# Patient Record
Sex: Male | Born: 2000 | Race: Black or African American | Hispanic: No | Marital: Single | Smoking: Never smoker
Health system: Southern US, Community
[De-identification: ages and names within clinical notes are randomized; demographics above are authoritative.]

## PROBLEM LIST (undated history)

## (undated) DIAGNOSIS — G43909 Migraine, unspecified, not intractable, without status migrainosus: Secondary | ICD-10-CM

## (undated) DIAGNOSIS — F909 Attention-deficit hyperactivity disorder, unspecified type: Secondary | ICD-10-CM

---
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
## 2022-04-02 ENCOUNTER — Ambulatory Visit: Payer: Self-pay | Admitting: Critical Care Medicine

## 2023-01-16 ENCOUNTER — Emergency Department (HOSPITAL_COMMUNITY): Admission: EM | Admit: 2023-01-16 | Discharge: 2023-01-16 | Discharge status: Against Medical Advice | Payer: Self-pay

## 2023-01-16 NOTE — ED Notes (Signed)
Patient called for triage x3 with no answer. Assumed to have left.

## 2023-07-25 ENCOUNTER — Ambulatory Visit: Admission: EM | Admit: 2023-07-25 | Discharge: 2023-07-25 | Discharge status: Against Medical Advice | Payer: Self-pay

## 2023-07-25 HISTORY — DX: Migraine, unspecified, not intractable, without status migrainosus: G43.909

## 2023-07-25 NOTE — ED Triage Notes (Signed)
Pt states headache for the past 2 days with ringing in his right ear.  States he has a history of migraines and usually takes tylenol,but it is not working for this headache.

## 2023-09-26 ENCOUNTER — Emergency Department (HOSPITAL_COMMUNITY): Payer: Self-pay

## 2023-09-26 ENCOUNTER — Emergency Department (HOSPITAL_COMMUNITY)
Admission: EM | Admit: 2023-09-26 | Discharge: 2023-09-26 | Discharge status: Against Medical Advice | Payer: Self-pay | Attending: Emergency Medicine | Admitting: Emergency Medicine

## 2023-09-26 ENCOUNTER — Other Ambulatory Visit: Payer: Self-pay

## 2023-09-26 DIAGNOSIS — W25XXXA Contact with sharp glass, initial encounter: Secondary | ICD-10-CM | POA: Insufficient documentation

## 2023-09-26 DIAGNOSIS — S61411A Laceration without foreign body of right hand, initial encounter: Secondary | ICD-10-CM | POA: Insufficient documentation

## 2023-09-26 DIAGNOSIS — Y9371 Activity, boxing: Secondary | ICD-10-CM | POA: Insufficient documentation

## 2023-09-26 NOTE — ED Triage Notes (Signed)
 Pt was practicing boxing while watching self in mirror and accidentally hit mirror with right hand. Right middle knuckle laceration. Hand wrapped in triage and bleeding controlled. Unknown last tetanus shot

## 2023-09-26 NOTE — ED Notes (Signed)
 Pt states he is leaving and will return tomorrow, moved to [facility].

## 2023-09-27 ENCOUNTER — Emergency Department (HOSPITAL_COMMUNITY)
Admission: EM | Admit: 2023-09-27 | Discharge: 2023-09-27 | Discharge status: Against Medical Advice | Payer: Self-pay | Attending: Emergency Medicine | Admitting: Emergency Medicine

## 2023-09-27 ENCOUNTER — Other Ambulatory Visit: Payer: Self-pay

## 2023-09-27 ENCOUNTER — Encounter (HOSPITAL_COMMUNITY): Payer: Self-pay

## 2023-09-27 DIAGNOSIS — W25XXXA Contact with sharp glass, initial encounter: Secondary | ICD-10-CM | POA: Insufficient documentation

## 2023-09-27 DIAGNOSIS — S61212A Laceration without foreign body of right middle finger without damage to nail, initial encounter: Secondary | ICD-10-CM | POA: Insufficient documentation

## 2023-09-27 DIAGNOSIS — Z5321 Procedure and treatment not carried out due to patient leaving prior to being seen by health care provider: Secondary | ICD-10-CM | POA: Insufficient documentation

## 2023-09-27 NOTE — ED Notes (Signed)
 Verbal consent given for MSE

## 2023-09-27 NOTE — ED Notes (Signed)
 Pt requesting hand to be wrapped. RN wrapped hand with non-adhesive guaze and coban. Bleeding is controlled

## 2023-09-27 NOTE — ED Triage Notes (Addendum)
 Pt states he accidentally hit window yesterday, broke glass, endorses small lac to R middle finger; LWBS yesterday due to having to work; endorses some pain; wound wrapped on arrival, bleeding controlled

## 2023-11-14 ENCOUNTER — Encounter (HOSPITAL_COMMUNITY): Payer: Self-pay | Admitting: Emergency Medicine

## 2023-11-14 ENCOUNTER — Ambulatory Visit (HOSPITAL_COMMUNITY)
Admission: EM | Admit: 2023-11-14 | Discharge: 2023-11-14 | Disposition: A | Discharge status: Home / Self Care | Payer: Self-pay | Attending: Emergency Medicine | Admitting: Emergency Medicine

## 2023-11-14 DIAGNOSIS — K047 Periapical abscess without sinus: Secondary | ICD-10-CM

## 2023-11-14 DIAGNOSIS — K0889 Other specified disorders of teeth and supporting structures: Secondary | ICD-10-CM

## 2023-11-14 HISTORY — DX: Attention-deficit hyperactivity disorder, unspecified type: F90.9

## 2023-11-14 MED ORDER — AMOXICILLIN-POT CLAVULANATE 875-125 MG PO TABS: 1.0000 | ORAL_TABLET | Freq: Two times a day (BID) | ORAL | 0 refills | Status: DC

## 2023-11-14 MED ORDER — IBUPROFEN 800 MG PO TABS: 800.0000 mg | ORAL_TABLET | Freq: Three times a day (TID) | ORAL | 0 refills | Status: DC

## 2023-11-14 NOTE — Discharge Instructions (Signed)
 Start taking Augmentin twice daily for 7 days for dental abscess.  You can alternate between 800 mg ibuprofen and Tylenol as needed for pain.  Please keep your appointment with your dentist on Tuesday to follow-up.  Return here as needed.

## 2023-11-14 NOTE — ED Provider Notes (Signed)
 [facility]    CSN: [id number] Arrival date & time: 11/14/23  1650      History   Chief Complaint Chief Complaint  Patient presents with   Oral Swelling    HPI Luis Rasmussen is a 23 y.o. male.   Patient presents with right upper dental pain and swelling that began last night.  Denies fever.  Patient denies taking anything for pain.  Patient reports history of dental abscess.     Past Medical History:  Diagnosis Date   ADHD    Migraines     Patient Active Problem List   Diagnosis Date Noted   History of sore throat 12/20/2016   Right knee injury 01/07/2014   Allergic rhinitis 01/16/2013   Attention deficit hyperactivity disorder (ADHD) 03/01/2010    History reviewed. No pertinent surgical history.     Home Medications    Prior to Admission medications   Medication Sig Start Date End Date Taking? Authorizing Provider  amoxicillin-clavulanate (AUGMENTIN) 875-125 MG tablet Take 1 tablet by mouth every 12 (twelve) hours. 11/14/23  Yes Susann Givens, Krishiv Sandler A, NP  ibuprofen (ADVIL) 800 MG tablet Take 1 tablet (800 mg total) by mouth 3 (three) times daily. 11/14/23  Yes Susann Givens, Itai Barbian A, NP  albuterol (PROVENTIL) (2.5 MG/3ML) 0.083% nebulizer solution Take 3 mLs (2.5 mg total) by nebulization every 4 (four) hours as needed for Wheezing (Please include all supplies and machine.). 11/08/10 11/17/10  Monica Becton, MD    Family History No family history on file.  Social History Social History   Tobacco Use   Smoking status: Never   Smokeless tobacco: Never  Vaping Use   Vaping status: Every Day  Substance Use Topics   Alcohol use: Not Currently   Drug use: Never     Allergies   Patient has no known allergies.   Review of Systems Review of Systems  Per HPI  Physical Exam Triage Vital Signs ED Triage Vitals  Encounter Vitals Group     BP 11/14/23 1748 (!) 150/128     Systolic BP Percentile --      Diastolic BP Percentile --       Pulse Rate 11/14/23 1748 99     Resp 11/14/23 1748 14     Temp 11/14/23 1748 98.4 F (36.9 C)     Temp Source 11/14/23 1748 Oral     SpO2 11/14/23 1748 99 %     Weight --      Height --      Head Circumference --      Peak Flow --      Pain Score 11/14/23 1746 2     Pain Loc --      Pain Education --      Exclude from Growth Chart --    No data found.  Updated Vital Signs BP 119/85   Pulse 99   Temp 98.4 F (36.9 C) (Oral)   Resp 14   SpO2 99%   Visual Acuity Right Eye Distance:   Left Eye Distance:   Bilateral Distance:    Right Eye Near:   Left Eye Near:    Bilateral Near:     Physical Exam Vitals and nursing note reviewed.  Constitutional:      General: He is awake. He is not in acute distress.    Appearance: Normal appearance. He is well-developed and well-groomed. He is not ill-appearing.  HENT:     Mouth/Throat:     Dentition: Abnormal dentition. Dental  tenderness, gingival swelling and dental abscesses present.  Neurological:     Mental Status: He is alert.  Psychiatric:        Behavior: Behavior is cooperative.      UC Treatments / Results  Labs (all labs ordered are listed, but only abnormal results are displayed) Labs Reviewed - No data to display  EKG   Radiology No results found.  Procedures Procedures (including critical care time)  Medications Ordered in UC Medications - No data to display  Initial Impression / Assessment and Plan / UC Course  I have reviewed the triage vital signs and the nursing notes.  Pertinent labs & imaging results that were available during my care of the patient were reviewed by me and considered in my medical decision making (see chart for details).     Patient presented with right upper dental pain and swelling that began last night.  Denies fever.  Reports history of dental abscess.  Upon assessment dental tenderness, gingival swelling, and dental abscess with purulent drainage noted to right upper  mouth.  Offered patient Toradol injection for pain, patient declined.  Prescribed Augmentin for dental abscess coverage.  Prescribed 800 mg ibuprofen as needed for pain.  Patient states that he has an appointment with a dentist on this upcoming Tuesday.  Recommended patient keep appointment to follow-up about dental abscess.  Discussed return precautions. Final Clinical Impressions(s) / UC Diagnoses   Final diagnoses:  Dental abscess  Pain, dental     Discharge Instructions      Start taking Augmentin twice daily for 7 days for dental abscess.  You can alternate between 800 mg ibuprofen and Tylenol as needed for pain.  Please keep your appointment with your dentist on Tuesday to follow-up.  Return here as needed.    ED Prescriptions     Medication Sig Dispense Auth. Provider   amoxicillin-clavulanate (AUGMENTIN) 875-125 MG tablet Take 1 tablet by mouth every 12 (twelve) hours. 14 tablet Susann Givens, Tarrie Mcmichen A, NP   ibuprofen (ADVIL) 800 MG tablet Take 1 tablet (800 mg total) by mouth 3 (three) times daily. 21 tablet Wynonia Lawman A, NP      PDMP not reviewed this encounter.   Wynonia Lawman A, NP 11/14/23 1944

## 2023-11-14 NOTE — ED Triage Notes (Signed)
 Pt c/o right dental swelling and pain that last night. Hasn't taken any medications for pain.

## 2024-06-02 ENCOUNTER — Ambulatory Visit
Admission: EM | Admit: 2024-06-02 | Discharge: 2024-06-02 | Disposition: A | Discharge status: Home / Self Care | Payer: Self-pay | Attending: Family Medicine | Admitting: Family Medicine

## 2024-06-02 DIAGNOSIS — B349 Viral infection, unspecified: Secondary | ICD-10-CM

## 2024-06-02 DIAGNOSIS — K047 Periapical abscess without sinus: Secondary | ICD-10-CM

## 2024-06-02 MED ORDER — IBUPROFEN 800 MG PO TABS: 800.0000 mg | ORAL_TABLET | Freq: Three times a day (TID) | ORAL | 0 refills | Status: DC

## 2024-06-02 MED ORDER — ONDANSETRON 4 MG PO TBDP: 4.0000 mg | ORAL_TABLET | Freq: Three times a day (TID) | ORAL | 0 refills | Status: AC | PRN

## 2024-06-02 MED ORDER — AMOXICILLIN-POT CLAVULANATE 875-125 MG PO TABS: 1.0000 | ORAL_TABLET | Freq: Two times a day (BID) | ORAL | 0 refills | Status: DC

## 2024-06-02 NOTE — Discharge Instructions (Signed)
 Start Augmentin  for a suspected developing dental infection. Use ibuprofen  for pain and inflammation. Ondansetron  can be used for nausea and vomiting. Push fluids, get rest.

## 2024-06-02 NOTE — ED Triage Notes (Addendum)
 Pt c/o left upper and lower dental pain x 1 month dentist appt 9/15-c/o nausea, HA, fatigue, abd pain x 3 days-feels sx are r/t the dental issue-taking tylenol/last dose ~6 hours PTA-NAD-steady gait

## 2024-06-02 NOTE — ED Provider Notes (Signed)
 [facility]  Note:  This document was prepared using Conservation officer, historic buildings and may include unintentional dictation errors.  MRN: [id number] DOB: [birth year]  Subjective:   Luis Rasmussen is a 23 y.o. male presenting for 1 month history of persistent left sided dental, oral pain. In the past 3 days has had nausea with vomiting this morning, has had chest pain this morning too. Reports headaches, malaise, fatigue, occasional dry cough. No antibiotics recently. No diarrhea, throat pain, wheezing, runny or stuffy nose. No asthma. Vapes daily. No marijuana. No history of GI disorders.    Current Facility-Administered Medications:    albuterol  (PROVENTIL ) nebulizer solution 2.5 mg, 2.5 mg, Nebulization, Q4H, Koval, Peter  Current Outpatient Medications:    amoxicillin -clavulanate (AUGMENTIN ) 875-125 MG tablet, Take 1 tablet by mouth every 12 (twelve) hours., Disp: 14 tablet, Rfl: 0   ibuprofen  (ADVIL ) 800 MG tablet, Take 1 tablet (800 mg total) by mouth 3 (three) times daily., Disp: 21 tablet, Rfl: 0   No Known Allergies  Past Medical History:  Diagnosis Date   ADHD    Migraines      History reviewed. No pertinent surgical history.  No family history on file.  Social History   Tobacco Use   Smoking status: Never   Smokeless tobacco: Never  Vaping Use   Vaping status: Every Day  Substance Use Topics   Alcohol use: Not Currently   Drug use: Never    ROS   Objective:   Vitals: BP 128/67 (BP Location: Right Arm)   Pulse (!) 50   Temp 98.2 F (36.8 C) (Oral)   Resp 20   SpO2 98%   Physical Exam Constitutional:      General: He is not in acute distress.    Appearance: Normal appearance. He is well-developed and normal weight. He is not ill-appearing, toxic-appearing or diaphoretic.  HENT:     Head: Normocephalic and atraumatic.     Right Ear: External ear normal.     Left Ear: External ear normal.     Nose: Nose normal.      Mouth/Throat:     Mouth: Mucous membranes are moist.     Pharynx: No pharyngeal swelling, oropharyngeal exudate, posterior oropharyngeal erythema or uvula swelling.     Tonsils: No tonsillar exudate or tonsillar abscesses. 0 on the right. 0 on the left.   Eyes:     General: No scleral icterus.       Right eye: No discharge.        Left eye: No discharge.     Extraocular Movements: Extraocular movements intact.  Cardiovascular:     Rate and Rhythm: Normal rate and regular rhythm.     Heart sounds: Normal heart sounds. No murmur heard.    No friction rub. No gallop.  Pulmonary:     Effort: Pulmonary effort is normal. No respiratory distress.     Breath sounds: Normal breath sounds. No stridor. No wheezing, rhonchi or rales.  Abdominal:     General: Bowel sounds are normal. There is no distension.     Palpations: Abdomen is soft. There is no mass.     Tenderness: There is no abdominal tenderness. There is no right CVA tenderness, left CVA tenderness, guarding or rebound.  Musculoskeletal:     Cervical back: Normal range of motion.  Neurological:     Mental Status: He is alert and oriented to person, place, and time.     Cranial Nerves: No cranial nerve  deficit.     Motor: No weakness.     Coordination: Coordination normal.     Gait: Gait normal.  Psychiatric:        Mood and Affect: Mood normal.        Behavior: Behavior normal.        Thought Content: Thought content normal.        Judgment: Judgment normal.     Assessment and Plan :   PDMP not reviewed this encounter.  1. Dental infection   2. Acute viral syndrome    Recommend managing for dental infection with concurrent new onset acute viral syndrome.  Start Augmentin , use Zofran , ibuprofen  and supportive care.  Keep follow-up appointment with a dentist.  Counseled patient on potential for adverse effects with medications prescribed/recommended today, ER and return-to-clinic precautions discussed, patient verbalized  understanding.    Christopher Savannah, [place] 06/02/24 1436

## 2024-07-10 ENCOUNTER — Ambulatory Visit
Admission: EM | Admit: 2024-07-10 | Discharge: 2024-07-10 | Disposition: A | Discharge status: Home / Self Care | Payer: Self-pay | Attending: Family Medicine | Admitting: Family Medicine

## 2024-07-10 DIAGNOSIS — K0889 Other specified disorders of teeth and supporting structures: Secondary | ICD-10-CM

## 2024-07-10 MED ORDER — NAPROXEN 500 MG PO TABS: 500.0000 mg | ORAL_TABLET | Freq: Two times a day (BID) | ORAL | 0 refills | Status: DC | PRN

## 2024-07-10 MED ORDER — AMOXICILLIN-POT CLAVULANATE 875-125 MG PO TABS: 1.0000 | ORAL_TABLET | Freq: Two times a day (BID) | ORAL | 0 refills | Status: DC

## 2024-07-10 NOTE — ED Provider Notes (Signed)
 [facility]    CSN: [id number] Arrival date & time: 07/10/24  1301      History   Chief Complaint Chief Complaint  Patient presents with   Dental Pain    HPI Luis Rasmussen is a 23 y.o. male presents for dental pain.  Patient reports 2 days of right lower molar dental pain with some facial swelling.  He also thinks he may have an impacted wisdom tooth.  No fevers chills.  He has been treated for dental infections in the same area in the past.  He states he is trying to establish with a dentist.  No other concerns at this time   Dental Pain   Past Medical History:  Diagnosis Date   ADHD    Migraines     Patient Active Problem List   Diagnosis Date Noted   History of sore throat 12/20/2016   Right knee injury 01/07/2014   Allergic rhinitis 01/16/2013   Attention deficit hyperactivity disorder (ADHD) 03/01/2010    History reviewed. No pertinent surgical history.     Home Medications    Prior to Admission medications   Medication Sig Start Date End Date Taking? Authorizing Provider  amoxicillin -clavulanate (AUGMENTIN ) 875-125 MG tablet Take 1 tablet by mouth every 12 (twelve) hours. 07/10/24  Yes Rosamund Nyland, Jodi R, NP  naproxen (NAPROSYN) 500 MG tablet Take 1 tablet (500 mg total) by mouth 2 (two) times daily as needed. 07/10/24  Yes Arend Bahl, Jodi R, NP  ondansetron  (ZOFRAN -ODT) 4 MG disintegrating tablet Take 1 tablet (4 mg total) by mouth every 8 (eight) hours as needed for nausea or vomiting. 06/02/24   Christopher Savannah, PA-C  albuterol  (PROVENTIL ) (2.5 MG/3ML) 0.083% nebulizer solution Take 3 mLs (2.5 mg total) by nebulization every 4 (four) hours as needed for Wheezing (Please include all supplies and machine.). 11/08/10 11/17/10  Curtis Debby PARAS, MD    Family History History reviewed. No pertinent family history.  Social History Social History   Tobacco Use   Smoking status: Never   Smokeless tobacco: Never  Vaping Use   Vaping status: Every Day   Substance Use Topics   Alcohol use: Not Currently   Drug use: Never     Allergies   Patient has no known allergies.   Review of Systems Review of Systems  HENT:  Positive for dental problem.      Physical Exam Triage Vital Signs ED Triage Vitals  Encounter Vitals Group     BP 07/10/24 1327 93/63     Girls Systolic BP Percentile --      Girls Diastolic BP Percentile --      Boys Systolic BP Percentile --      Boys Diastolic BP Percentile --      Pulse Rate 07/10/24 1326 91     Resp 07/10/24 1326 17     Temp 07/10/24 1326 98.3 F (36.8 C)     Temp src --      SpO2 07/10/24 1326 97 %     Weight --      Height --      Head Circumference --      Peak Flow --      Pain Score 07/10/24 1325 8     Pain Loc --      Pain Education --      Exclude from Growth Chart --    No data found.  Updated Vital Signs BP 93/63   Pulse 91   Temp 98.3 F (36.8 C)  Resp 17   SpO2 97%   Visual Acuity Right Eye Distance:   Left Eye Distance:   Bilateral Distance:    Right Eye Near:   Left Eye Near:    Bilateral Near:     Physical Exam Vitals and nursing note reviewed.  Constitutional:      General: He is not in acute distress.    Appearance: Normal appearance. He is not ill-appearing.  HENT:     Head: Normocephalic and atraumatic.     Mouth/Throat:     Lips: Pink.     Mouth: Mucous membranes are moist. No angioedema.     Dentition: Dental tenderness present. No gingival swelling or dental abscesses.     Tongue: Tongue does not deviate from midline.   Eyes:     Pupils: Pupils are equal, round, and reactive to light.  Cardiovascular:     Rate and Rhythm: Normal rate.  Pulmonary:     Effort: Pulmonary effort is normal.  Skin:    General: Skin is warm and dry.  Neurological:     General: No focal deficit present.     Mental Status: He is alert and oriented to person, place, and time.  Psychiatric:        Mood and Affect: Mood normal.        Behavior: Behavior  normal.      UC Treatments / Results  Labs (all labs ordered are listed, but only abnormal results are displayed) Labs Reviewed - No data to display  EKG   Radiology No results found.  Procedures Procedures (including critical care time)  Medications Ordered in UC Medications - No data to display  Initial Impression / Assessment and Plan / UC Course  I have reviewed the triage vital signs and the nursing notes.  Pertinent labs & imaging results that were available during my care of the patient were reviewed by me and considered in my medical decision making (see chart for details).     Reviewed exam and symptoms with patient.  He is afebrile and well-appearing naproxen.  Resources provided and advised patient he will need to see a dentist for further treatment of his symptoms.  Strict ER precautions reviewed and patient verbalized understanding Final Clinical Impressions(s) / UC Diagnoses   Final diagnoses:  Pain, dental     Discharge Instructions      Start Augmentin  twice daily for 7 days.  You may take naproxen twice daily as well for pain.  Please follow-up with dentist as soon as possible for further evaluation of your symptoms.  Please go to the ER for any worsening symptoms such as fever.  Hope you feel better soon!    ED Prescriptions     Medication Sig Dispense Auth. Provider   amoxicillin -clavulanate (AUGMENTIN ) 875-125 MG tablet Take 1 tablet by mouth every 12 (twelve) hours. 14 tablet Robbyn Hodkinson, Jodi R, NP   naproxen (NAPROSYN) 500 MG tablet Take 1 tablet (500 mg total) by mouth 2 (two) times daily as needed. 14 tablet Korbin Mapps, Jodi R, NP      PDMP not reviewed this encounter.   Loreda Myla SAUNDERS, NP 07/10/24 1357

## 2024-07-10 NOTE — ED Triage Notes (Signed)
 Pt present with dental pain x two days. Pt states this morning his face was stiff and swollen.

## 2024-07-10 NOTE — Discharge Instructions (Addendum)
 Start Augmentin  twice daily for 7 days.  You may take naproxen twice daily as well for pain.  Please follow-up with dentist as soon as possible for further evaluation of your symptoms.  Please go to the ER for any worsening symptoms such as fever.  Hope you feel better soon!

## 2024-08-06 ENCOUNTER — Ambulatory Visit
Admission: EM | Admit: 2024-08-06 | Discharge: 2024-08-06 | Disposition: A | Discharge status: Home / Self Care | Payer: Self-pay | Attending: Family Medicine | Admitting: Family Medicine

## 2024-08-06 DIAGNOSIS — K047 Periapical abscess without sinus: Secondary | ICD-10-CM

## 2024-08-06 DIAGNOSIS — K0889 Other specified disorders of teeth and supporting structures: Secondary | ICD-10-CM

## 2024-08-06 MED ORDER — NAPROXEN 500 MG PO TABS: 500.0000 mg | ORAL_TABLET | Freq: Two times a day (BID) | ORAL | 0 refills | Status: DC | PRN

## 2024-08-06 MED ORDER — AMOXICILLIN-POT CLAVULANATE 875-125 MG PO TABS: 1.0000 | ORAL_TABLET | Freq: Two times a day (BID) | ORAL | 0 refills | Status: AC

## 2024-08-06 NOTE — Discharge Instructions (Signed)
[facility] [place] Address: [address], [place], [place] [address] Phone: [phone]  [place] [phone] extension [phone] [address].  Dr. Donn Pierini [phone] [place] [phone] [address] [facility].  Rescue mission [phone] extension [phone] [address] [place], [place], [address] First come first serve for the first 10 clients.  May do simple extractions only, no wisdom teeth or surgery.  You may try the second for Thursday of the month starting at [employer] You may call the school to see if they are still helping to provide dental care for emergent cases.

## 2024-08-06 NOTE — ED Triage Notes (Signed)
 Pt c/o right upper dental pain for a very long time-return x 2 days with facial swelling c/o-last dose tylenol yesterday-NAD-steady gait

## 2024-08-06 NOTE — ED Provider Notes (Signed)
 [facility]  Note:  This document was prepared using Conservation officer, historic buildings and may include unintentional dictation errors.  MRN: [id number] DOB: 02-25-2001  Subjective:   Luis Rasmussen is a 23 y.o. male presenting for 2-day history of recurrent left upper dental pain with facial pain and mild swelling.  Patient last had this problem a month ago and underwent a course of Augmentin .  He has not seen a dental specialist.  He is concerned that it will worsen to that point again.   Current Facility-Administered Medications:    albuterol  (PROVENTIL ) nebulizer solution 2.5 mg, 2.5 mg, Nebulization, Q4H, Koval, Peter  Current Outpatient Medications:    amoxicillin -clavulanate (AUGMENTIN ) 875-125 MG tablet, Take 1 tablet by mouth every 12 (twelve) hours., Disp: 14 tablet, Rfl: 0   naproxen (NAPROSYN) 500 MG tablet, Take 1 tablet (500 mg total) by mouth 2 (two) times daily as needed., Disp: 14 tablet, Rfl: 0   ondansetron  (ZOFRAN -ODT) 4 MG disintegrating tablet, Take 1 tablet (4 mg total) by mouth every 8 (eight) hours as needed for nausea or vomiting., Disp: 20 tablet, Rfl: 0   No Known Allergies  Past Medical History:  Diagnosis Date   ADHD    Migraines      History reviewed. No pertinent surgical history.  No family history on file.  Social History   Tobacco Use   Smoking status: Never   Smokeless tobacco: Never  Vaping Use   Vaping status: Every Day  Substance Use Topics   Alcohol use: Not Currently   Drug use: Never    ROS   Objective:   Vitals: BP (P) 114/75 (BP Location: Right Arm)   Pulse (P) 67   Temp (P) 97.8 F (36.6 C) (Oral)   Resp (P) 16   SpO2 (P) 97%   Physical Exam Constitutional:      General: He is not in acute distress.    Appearance: Normal appearance. He is well-developed and normal weight. He is not ill-appearing, toxic-appearing or diaphoretic.  HENT:     Head: Normocephalic and atraumatic.     Right  Ear: External ear normal.     Left Ear: External ear normal.     Nose: Nose normal.     Mouth/Throat:     Dentition: Abnormal dentition. Does not have dentures. Dental tenderness and dental caries present. No gingival swelling, dental abscesses or gum lesions.     Pharynx: Oropharynx is clear. No pharyngeal swelling, oropharyngeal exudate, posterior oropharyngeal erythema or uvula swelling.     Tonsils: No tonsillar exudate or tonsillar abscesses. 0 on the right. 0 on the left.   Eyes:     General: No scleral icterus.       Right eye: No discharge.        Left eye: No discharge.     Extraocular Movements: Extraocular movements intact.  Cardiovascular:     Rate and Rhythm: Normal rate.  Pulmonary:     Effort: Pulmonary effort is normal.  Musculoskeletal:     Cervical back: Normal range of motion.  Neurological:     Mental Status: He is alert and oriented to person, place, and time.  Psychiatric:        Mood and Affect: Mood normal.        Behavior: Behavior normal.        Thought Content: Thought content normal.        Judgment: Judgment normal.     Assessment and Plan :  PDMP not reviewed this encounter.  1. Dental infection   2. Pain, dental    Start Augmentin  for dental infection/abscess, use naproxen for pain and inflammation. Emphasized need for dental specialist consult especially since he already received a course of antibiotics and risks antibiotic resistance, C. difficile. Counseled patient on potential for adverse effects with medications prescribed/recommended today, strict ER and return-to-clinic precautions discussed, patient verbalized understanding.    Christopher Savannah, PA-C 08/06/24 1231

## 2024-09-29 ENCOUNTER — Ambulatory Visit
Admission: EM | Admit: 2024-09-29 | Discharge: 2024-09-29 | Disposition: A | Discharge status: Home / Self Care | Payer: Self-pay | Attending: Family Medicine | Admitting: Family Medicine

## 2024-09-29 DIAGNOSIS — K047 Periapical abscess without sinus: Secondary | ICD-10-CM

## 2024-09-29 MED ORDER — NAPROXEN 500 MG PO TABS: 500.0000 mg | ORAL_TABLET | Freq: Two times a day (BID) | ORAL | 0 refills | Status: AC | PRN

## 2024-09-29 MED ORDER — AMOXICILLIN-POT CLAVULANATE 875-125 MG PO TABS: 1.0000 | ORAL_TABLET | Freq: Two times a day (BID) | ORAL | 0 refills | Status: AC

## 2024-09-29 NOTE — Discharge Instructions (Addendum)
 Start Augmentin  antibiotic twice daily for 7 days.  You may use naproxen  twice daily as needed for pain.  Please try to follow-up with a dentist as soon as you can have the tooth treated as antibiotics are only a temporary solution.  Please go to the ER if you develop any fevers or facial swelling.  Hope you feel better soon!

## 2024-09-29 NOTE — ED Triage Notes (Signed)
 Pt present with c/o a dental abscess x last night. Pt states the dental abscesses are recurring. States he has pain when chewing.

## 2024-09-29 NOTE — ED Provider Notes (Signed)
 " [facility]    CSN: [id number] Arrival date & time: 09/29/24  1330      History   Chief Complaint No chief complaint on file.   HPI Luis Rasmussen is a 24 y.o. male presents for dental pain.  Patient has been on broken tooth on the right upper molar.  Last night he developed some pain to the area.  He has been seen frequently in urgent care for antibiotics regarding this.  He was treated in September, October, and November 2025 with antibiotics.  He does state he was finally able to see a dentist at the beginning of December and was told he needs a root canal.  He has no dental insurance and was not able to pay out-of-pocket for this.  He states he tried to find some secretary/administrator.  He took some Goody powder for symptoms.  Denies any fevers or facial swelling.  No other concerns.  HPI  Past Medical History:  Diagnosis Date   ADHD    Migraines     Patient Active Problem List   Diagnosis Date Noted   History of sore throat 12/20/2016   Right knee injury 01/07/2014   Allergic rhinitis 01/16/2013   Attention deficit hyperactivity disorder (ADHD) 03/01/2010    History reviewed. No pertinent surgical history.     Home Medications    Prior to Admission medications  Medication Sig Start Date End Date Taking? Authorizing Provider  amoxicillin -clavulanate (AUGMENTIN ) 875-125 MG tablet Take 1 tablet by mouth every 12 (twelve) hours. 09/29/24  Yes Dalene Robards, Jodi R, NP  naproxen  (NAPROSYN ) 500 MG tablet Take 1 tablet (500 mg total) by mouth 2 (two) times daily as needed. 09/29/24  Yes Kelly Ranieri, Jodi R, NP  ondansetron  (ZOFRAN -ODT) 4 MG disintegrating tablet Take 1 tablet (4 mg total) by mouth every 8 (eight) hours as needed for nausea or vomiting. 06/02/24   Christopher Savannah, PA-C  albuterol  (PROVENTIL ) (2.5 MG/3ML) 0.083% nebulizer solution Take 3 mLs (2.5 mg total) by nebulization every 4 (four) hours as needed for Wheezing (Please include all supplies and machine.). 11/08/10 11/17/10   Curtis Debby PARAS, MD    Family History History reviewed. No pertinent family history.  Social History Social History[1]   Allergies   Patient has no known allergies.   Review of Systems Review of Systems  HENT:  Positive for dental problem.      Physical Exam Triage Vital Signs ED Triage Vitals  Encounter Vitals Group     BP 09/29/24 1520 93/60     Girls Systolic BP Percentile --      Girls Diastolic BP Percentile --      Boys Systolic BP Percentile --      Boys Diastolic BP Percentile --      Pulse Rate 09/29/24 1520 71     Resp 09/29/24 1520 18     Temp 09/29/24 1520 98.9 F (37.2 C)     Temp src --      SpO2 09/29/24 1520 97 %     Weight --      Height --      Head Circumference --      Peak Flow --      Pain Score 09/29/24 1519 8     Pain Loc --      Pain Education --      Exclude from Growth Chart --    No data found.  Updated Vital Signs BP 93/60   Pulse 71   Temp  98.9 F (37.2 C)   Resp 18   SpO2 97%   Visual Acuity Right Eye Distance:   Left Eye Distance:   Bilateral Distance:    Right Eye Near:   Left Eye Near:    Bilateral Near:     Physical Exam Vitals and nursing note reviewed.  Constitutional:      General: He is not in acute distress.    Appearance: Normal appearance. He is not ill-appearing.  HENT:     Head: Normocephalic and atraumatic.     Mouth/Throat:      Comments: Tooth #16 broken.  There is mild swelling of the gumline with tenderness without abscess.  No drainage.  No facial swelling. Eyes:     Pupils: Pupils are equal, round, and reactive to light.  Cardiovascular:     Rate and Rhythm: Normal rate.  Pulmonary:     Effort: Pulmonary effort is normal.  Skin:    General: Skin is warm and dry.  Neurological:     General: No focal deficit present.     Mental Status: He is alert and oriented to person, place, and time.  Psychiatric:        Mood and Affect: Mood normal.        Behavior: Behavior normal.       UC Treatments / Results  Labs (all labs ordered are listed, but only abnormal results are displayed) Labs Reviewed - No data to display  EKG   Radiology No results found.  Procedures Procedures (including critical care time)  Medications Ordered in UC Medications - No data to display  Initial Impression / Assessment and Plan / UC Course  I have reviewed the triage vital signs and the nursing notes.  Pertinent labs & imaging results that were available during my care of the patient were reviewed by me and considered in my medical decision making (see chart for details).     I did discuss with patient the importance of him having the tooth treated by a dentist as he cannot continue to be on antibiotics  due to side effect profile such as resistance, C. difficile, excetra.  I did give patient dental resources to follow-up with.  Will start Augmentin  and naproxen .  ER precautions reviewed and patient verbalized understanding. Final Clinical Impressions(s) / UC Diagnoses   Final diagnoses:  Dental infection     Discharge Instructions      Start Augmentin  antibiotic twice daily for 7 days.  You may use naproxen  twice daily as needed for pain.  Please try to follow-up with a dentist as soon as you can have the tooth treated as antibiotics are only a temporary solution.  Please go to the ER if you develop any fevers or facial swelling.  Hope you feel better soon!    ED Prescriptions     Medication Sig Dispense Auth. Provider   amoxicillin -clavulanate (AUGMENTIN ) 875-125 MG tablet Take 1 tablet by mouth every 12 (twelve) hours. 14 tablet Araseli Sherry, Jodi R, NP   naproxen  (NAPROSYN ) 500 MG tablet Take 1 tablet (500 mg total) by mouth 2 (two) times daily as needed. 14 tablet Simone Rodenbeck, Jodi R, NP      PDMP not reviewed this encounter.    [1]  Social History Tobacco Use   Smoking status: Never   Smokeless tobacco: Never  Vaping Use   Vaping status: Every Day  Substance  Use Topics   Alcohol use: Not Currently   Drug use: Never  Loreda Myla SAUNDERS, NP 09/29/24 1536  "
